# Patient Record
Sex: Female | Born: 1965 | Race: Black or African American | Hispanic: No | Marital: Single | Smoking: Current every day smoker
Health system: Southern US, Community
[De-identification: ages and names within clinical notes are randomized; demographics above are authoritative.]

---

## 2011-09-19 ENCOUNTER — Encounter (HOSPITAL_COMMUNITY): Payer: Self-pay | Admitting: Emergency Medicine

## 2011-09-19 ENCOUNTER — Emergency Department (HOSPITAL_COMMUNITY): Payer: Self-pay

## 2011-09-19 ENCOUNTER — Emergency Department (HOSPITAL_COMMUNITY)
Admission: EM | Admit: 2011-09-19 | Discharge: 2011-09-19 | Disposition: A | Discharge status: Home / Self Care | Payer: Self-pay | Attending: Emergency Medicine | Admitting: Emergency Medicine

## 2011-09-19 DIAGNOSIS — M25469 Effusion, unspecified knee: Secondary | ICD-10-CM | POA: Insufficient documentation

## 2011-09-19 DIAGNOSIS — S8992XA Unspecified injury of left lower leg, initial encounter: Secondary | ICD-10-CM

## 2011-09-19 NOTE — Discharge Instructions (Signed)

## 2011-09-19 NOTE — ED Notes (Signed)
Ortho tech paged to apply knee sleeve.

## 2011-09-19 NOTE — ED Provider Notes (Signed)
History     CSN: [id number]  Arrival date & time 09/19/11  1846   First MD Initiated Contact with Patient 09/19/11 1943      Chief Complaint  Patient presents with  . Leg Pain    (Consider location/radiation/quality/duration/timing/severity/associated sxs/prior treatment) HPI  46 year old female presents complaining of left knee injury. Patient states she was walking down the steps today while carrying her baby on the arm. She slipped on some wet steps and lost her balance. States she was trying to prevent falling on the baby, so instead she fell on her left knee and right shoulder. She denies hitting her head or LOC. She was able to ambulate afterward. she didn't notice any significant pain until later. Pain is primarily to the anterior aspect of her left knee, worsening with movement. Describe pain as gradual in onset, intermittent, and describe as sharp, throbbing sensation. Denies hip or ankle pain.  Denies any significant pain to R shoulder or arm.    History reviewed. No pertinent past medical history.  History reviewed. No pertinent past surgical history.  No family history on file.  History  Substance Use Topics  . Smoking status: Not on file  . Smokeless tobacco: Not on file  . Alcohol Use: Not on file    OB History    Grav Para Term Preterm Abortions TAB SAB Ect Mult Living                  Review of Systems  Constitutional: Negative for fatigue.  HENT: Negative for neck pain.   Musculoskeletal: Negative for back pain.  Neurological: Negative for headaches.  All other systems reviewed and are negative.    Allergies  Review of patient's allergies indicates no known allergies.  Home Medications   Current Outpatient Rx  Name Route Sig Dispense Refill  . IBUPROFEN 200 MG PO TABS Oral Take 400 mg by mouth every 8 (eight) hours as needed. For pain.      BP 126/83  Pulse 82  Temp(Src) 98.5 F (36.9 C) (Oral)  Resp 17  SpO2 100%  Physical Exam    Nursing note and vitals reviewed. Constitutional: She appears well-developed and well-nourished. No distress.  HENT:  Head: Normocephalic and atraumatic.  Eyes: Conjunctivae and EOM are normal. Pupils are equal, round, and reactive to light.  Neck: Normal range of motion. Neck supple.  Musculoskeletal:       Right shoulder: Normal.       Right hip: Normal.       Left hip: Normal.       Right knee: Normal.       Left knee: She exhibits swelling and ecchymosis. She exhibits normal range of motion, no effusion, no deformity, no laceration, no erythema, normal alignment and no LCL laxity. tenderness found. Medial joint line tenderness noted. No lateral joint line, no MCL, no LCL and no patellar tendon tenderness noted.    ED Course  Procedures (including critical care time)  Labs Reviewed - No data to display No results found.   No diagnosis found.  No results found for this or any previous visit. Dg Knee Complete 4 Views Left  09/19/2011  *RADIOLOGY REPORT*  Clinical Data: Leg pain.  History of trauma from a fall.  LEFT KNEE - COMPLETE 4+ VIEW  Comparison: No priors.  Findings: There is a suprapatellar effusion.  No acute displaced fracture, subluxation or dislocation is noted.  IMPRESSION: 1.  Negative for acute bony trauma. 2.  Small suprapatellar  effusion.  Original Report Authenticated By: Florencia Reasons, M.D.      MDM  L knee injury from fall.  No other significant injury.  Xray of L knee unremarkable for fx or dislocation.  Bruising noted to anterior aspect of knee.  Able to ambulate.  Will offer knee sleeve.  RICE discussed.  Pt voice understanding.          Fayrene Helper, PA-C 09/19/11 2041

## 2011-09-19 NOTE — ED Notes (Signed)
PA Tran at bedside. 

## 2011-09-19 NOTE — ED Notes (Signed)
Pt states she slipped down the stairs and injured L knee. Pt able to ambulate, but painful. Slight swelling and bruising to L knee.

## 2011-09-19 NOTE — ED Notes (Signed)
Patient returned from X-ray 

## 2011-09-20 NOTE — ED Provider Notes (Signed)
Medical screening examination/treatment/procedure(s) were performed by non-physician practitioner and as supervising physician I was immediately available for consultation/collaboration.  Donnetta Hutching, MD 09/20/11 [phone]

## 2013-12-24 ENCOUNTER — Emergency Department (HOSPITAL_COMMUNITY)
Admission: EM | Admit: 2013-12-24 | Discharge: 2013-12-24 | Disposition: A | Discharge status: Home / Self Care | Payer: Self-pay | Attending: Emergency Medicine | Admitting: Emergency Medicine

## 2013-12-24 ENCOUNTER — Encounter (HOSPITAL_COMMUNITY): Payer: Self-pay | Admitting: Emergency Medicine

## 2013-12-24 DIAGNOSIS — F17200 Nicotine dependence, unspecified, uncomplicated: Secondary | ICD-10-CM | POA: Insufficient documentation

## 2013-12-24 DIAGNOSIS — K089 Disorder of teeth and supporting structures, unspecified: Secondary | ICD-10-CM | POA: Insufficient documentation

## 2013-12-24 DIAGNOSIS — K0889 Other specified disorders of teeth and supporting structures: Secondary | ICD-10-CM

## 2013-12-24 MED ORDER — HYDROCODONE-ACETAMINOPHEN 5-325 MG PO TABS: 1.0000 | ORAL_TABLET | Freq: Four times a day (QID) | ORAL | Status: DC | PRN

## 2013-12-24 MED ORDER — PENICILLIN V POTASSIUM 500 MG PO TABS: 500.0000 mg | ORAL_TABLET | Freq: Four times a day (QID) | ORAL | Status: AC

## 2013-12-24 NOTE — ED Provider Notes (Signed)
CSN: [id number]     Arrival date & time 12/24/13  1025 History  This chart was scribed for non-physician practitioner, Jaynie Crumble, PA-C,working with Ward Givens, MD, by Karle Plumber, ED Scribe. This patient was seen in room TR04C/TR04C and the patient's care was started at 12:34 PM.  Chief Complaint  Patient presents with  . Dental Pain   Patient is a 48 y.o. female presenting with tooth pain. The history is provided by the patient. No language interpreter was used.  Dental Pain Associated symptoms: no facial swelling and no fever    HPI Comments:  Marie Galloway is a 48 y.o. female who presents to the Emergency Department complaining of severe right upper and lower dental pain that started 3 days ago. Pt states she has been using three different kinds of Oragel and NyQuil with minimal relief of her pain. She has not been able to eat due to the pain. She denies fever, chills, difficulty swallowing or facial swelling. She does not have a dentist.  History reviewed. No pertinent past medical history. History reviewed. No pertinent past surgical history. History reviewed. No pertinent family history. History  Substance Use Topics  . Smoking status: Current Every Day Smoker    Types: Cigarettes  . Smokeless tobacco: Not on file  . Alcohol Use: Yes   OB History   Grav Para Term Preterm Abortions TAB SAB Ect Mult Living                 Review of Systems  Constitutional: Negative for fever and chills.  HENT: Positive for dental problem. Negative for facial swelling and trouble swallowing.   Hematological: Does not bruise/bleed easily.    Allergies  Review of patient's allergies indicates no known allergies.  Home Medications   Prior to Admission medications   Not on File   Triage Vitals: BP 147/84  Pulse 70  Temp(Src) 98.5 F (36.9 C) (Oral)  Resp 17  Ht  (1.626 m)  Wt 115 lb (52.164 kg)  BMI 19.73 kg/m2  SpO2 97% Physical Exam  Nursing note and vitals  reviewed. Constitutional: She is oriented to person, place, and time. She appears well-developed and well-nourished.  HENT:  Head: Normocephalic and atraumatic.  Poor dentition, multiple dental caries. Tender to palpation over right lower first and second molars as well as right upper first and second molars. Mild surrounding, inflammation over lower gum. No drainage. No facial swelling. No trismus. No swelling under the tongue.  Eyes: EOM are normal.  Neck: Normal range of motion.  Cardiovascular: Normal rate.   Pulmonary/Chest: Effort normal.  Musculoskeletal: Normal range of motion.  Neurological: She is alert and oriented to person, place, and time.  Skin: Skin is warm and dry.  Psychiatric: She has a normal mood and affect. Her behavior is normal.    ED Course  Procedures (including critical care time) DIAGNOSTIC STUDIES: Oxygen Saturation is 97% on RA, normal by my interpretation.   COORDINATION OF CARE: 12:36 PM- Will prescribe antibiotic, pain medication and refer to dentist. Pt verbalizes understanding and agrees to plan.  Medications - No data to display  Labs Review Labs Reviewed - No data to display  Imaging Review No results found.   EKG Interpretation None      MDM   Final diagnoses:  Pain, dental    Patient with dental pain, will start on a biotics, pain medications. I would dentist. At this time no evidence of Ludwig's angina. Patient is afebrile, nontoxic  appearing.   Filed Vitals:   12/24/13 1047 12/24/13 1247  BP: 147/84 152/85  Pulse: 70 64  Temp: 98.5 F (36.9 C) 98.3 F (36.8 C)  TempSrc: Oral Oral  Resp: 17 16  Height:  (1.626 m)   Weight: 115 lb (52.164 kg)   SpO2: 97% 100%    personally performed the services described in this documentation, which was scribed in my presence. The recorded information has been reviewed and is accurate.    Lottie Mussel, PA-C 12/24/13 1647

## 2013-12-24 NOTE — ED Notes (Signed)
She had a toothache x 3 days

## 2013-12-24 NOTE — ED Notes (Signed)
C/o right upper and lower dental pain x 3 days.

## 2013-12-24 NOTE — Discharge Instructions (Signed)
Penicillin for pain until all gone. norco for severe pain. Follow up with a dentist. Return if worsening.    Dental Pain A tooth ache may be caused by cavities (tooth decay). Cavities expose the nerve of the tooth to air and hot or cold temperatures. It may come from an infection or abscess (also called a boil or furuncle) around your tooth. It is also often caused by dental caries (tooth decay). This causes the pain you are having. DIAGNOSIS  Your caregiver can diagnose this problem by exam. TREATMENT   If caused by an infection, it may be treated with medications which kill germs (antibiotics) and pain medications as prescribed by your caregiver. Take medications as directed.  Only take over-the-counter or prescription medicines for pain, discomfort, or fever as directed by your caregiver.  Whether the tooth ache today is caused by infection or dental disease, you should see your dentist as soon as possible for further care. SEEK MEDICAL CARE IF: The exam and treatment you received today has been provided on an emergency basis only. This is not a substitute for complete medical or dental care. If your problem worsens or new problems (symptoms) appear, and you are unable to meet with your dentist, call or return to this location. SEEK IMMEDIATE MEDICAL CARE IF:   You have a fever.  You develop redness and swelling of your face, jaw, or neck.  You are unable to open your mouth.  You have severe pain uncontrolled by pain medicine. MAKE SURE YOU:   Understand these instructions.  Will watch your condition.  Will get help right away if you are not doing well or get worse. Document Released: 04/01/2005 Document Revised: 06/24/2011 Document Reviewed: 11/18/2007 [facility] Patient Information 2015 [place], [place]. This information is not intended to replace advice given to you by your health care provider. Make sure you discuss any questions you have with your health care provider.

## 2014-01-04 NOTE — ED Provider Notes (Signed)
Medical screening examination/treatment/procedure(s) were performed by non-physician practitioner and as supervising physician I was immediately available for consultation/collaboration.   EKG Interpretation None      Devoria Albe, MD, Armando Gang   Ward Givens, MD 01/04/14 [phone]

## 2015-10-25 ENCOUNTER — Encounter (HOSPITAL_COMMUNITY): Payer: Self-pay | Admitting: *Deleted

## 2015-10-25 ENCOUNTER — Emergency Department (HOSPITAL_COMMUNITY)
Admission: EM | Admit: 2015-10-25 | Discharge: 2015-10-25 | Disposition: A | Discharge status: Home / Self Care | Payer: Self-pay | Attending: Emergency Medicine | Admitting: Emergency Medicine

## 2015-10-25 DIAGNOSIS — F17210 Nicotine dependence, cigarettes, uncomplicated: Secondary | ICD-10-CM | POA: Insufficient documentation

## 2015-10-25 DIAGNOSIS — K0889 Other specified disorders of teeth and supporting structures: Secondary | ICD-10-CM | POA: Insufficient documentation

## 2015-10-25 MED ORDER — PENICILLIN V POTASSIUM 500 MG PO TABS: 500.0000 mg | ORAL_TABLET | Freq: Four times a day (QID) | ORAL | Status: DC

## 2015-10-25 NOTE — ED Provider Notes (Signed)
CSN: [id number]     Arrival date & time 10/25/15  1204 History  By signing my name below, I, [facility], attest that this documentation has been prepared under the direction and in the presence of Roxy Horseman, PA-C. Electronically Signed: Randell Patient, ED Scribe. 10/25/2015. 12:32 PM.    Chief Complaint  Patient presents with  . Dental Pain    The history is provided by the patient. No language interpreter was used.   HPI Comments: Marie Galloway is a 50 y.o. female who presents to the Emergency Department complaining of constant, moderate, gradually worsening right dental pain to the bottom and top onset 2 weeks ago. NKDA. Denies having a dentist currently. Denies fever.  History reviewed. No pertinent past medical history. History reviewed. No pertinent past surgical history. History reviewed. No pertinent family history. Social History  Substance Use Topics  . Smoking status: Current Every Day Smoker    Types: Cigarettes  . Smokeless tobacco: None  . Alcohol Use: Yes   OB History    No data available     Review of Systems  Constitutional: Negative for fever.  HENT: Positive for dental problem.       Allergies  Review of patient's allergies indicates no known allergies.  Home Medications   Prior to Admission medications   Medication Sig Start Date End Date Taking? Authorizing Provider  HYDROcodone-acetaminophen (NORCO) 5-325 MG per tablet Take 1 tablet by mouth every 6 (six) hours as needed for moderate pain. 12/24/13   Tatyana Kirichenko, PA-C   BP 121/87 mmHg  Pulse 74  Temp(Src) 98.5 F (36.9 C) (Oral)  Resp 20  SpO2 98% Physical Exam Physical Exam  Constitutional: Pt appears well-developed and well-nourished.  HENT:  Head: Normocephalic.  Right Ear: Tympanic membrane, external ear and ear canal normal.  Left Ear: Tympanic membrane, external ear and ear canal normal.  Nose: Nose normal. Right sinus exhibits no maxillary sinus tenderness and  no frontal sinus tenderness. Left sinus exhibits no maxillary sinus tenderness and no frontal sinus tenderness.  Mouth/Throat: Uvula is midline, oropharynx is clear and moist and mucous membranes are normal. No oral lesions. No uvula swelling or lacerations. No oropharyngeal exudate, posterior oropharyngeal edema, posterior oropharyngeal erythema or tonsillar abscesses.  Poor dentition No gingival swelling, fluctuance or induration No gross abscess  No sublingual edema, tenderness to palpation, or sign of Ludwig's angina, or deep space infection Pain at right lower rear molars Eyes: Conjunctivae are normal. Pupils are equal, round, and reactive to light. Right eye exhibits no discharge. Left eye exhibits no discharge.  Neck: Normal range of motion. Neck supple.  No stridor Handling secretions without difficulty No nuchal rigidity No cervical lymphadenopathy Cardiovascular: Normal rate, regular rhythm and normal heart sounds.   Pulmonary/Chest: Effort normal. No respiratory distress.  Equal chest rise  Abdominal: Soft. Bowel sounds are normal. Pt exhibits no distension. There is no tenderness.  Lymphadenopathy: Pt has no cervical adenopathy.  Neurological: Pt is alert and oriented x 4  Skin: Skin is warm and dry.  Psychiatric: Pt has a normal mood and affect.  Nursing note and vitals reviewed.   ED Course  Procedures   DIAGNOSTIC STUDIES: Oxygen Saturation is 98% on RA, normal by my interpretation.    COORDINATION OF CARE: 12:31 PM Will prescribe antibiotics. Will provide pt with referral to a dentist. Advised pt to follow-up with a dentist. Discussed treatment plan with pt at bedside and pt agreed to plan.   MDM   Final  diagnoses:  Pain, dental    Patient with dentalgia.  No abscess requiring immediate incision and drainage.  Exam not concerning for Ludwig's angina or pharyngeal abscess.  Will treat with penicillin. Pt instructed to follow-up with dentist.  Discussed return  precautions. Pt safe for discharge.   I personally performed the services described in this documentation, which was scribed in my presence. The recorded information has been reviewed and is accurate.     Roxy HorsemanRobert Lestat Golob, PA-C 10/25/15 1235  Gerhard Munchobert Lockwood, MD 10/26/15 [phone]

## 2015-10-25 NOTE — ED Notes (Signed)
Declined W/C at D/C and was escorted to lobby by RN. 

## 2015-10-25 NOTE — ED Notes (Signed)
PT reports RT lower dental pain.

## 2015-10-25 NOTE — Discharge Instructions (Signed)

## 2019-07-04 ENCOUNTER — Encounter (HOSPITAL_COMMUNITY): Payer: Self-pay

## 2019-07-04 ENCOUNTER — Emergency Department (HOSPITAL_COMMUNITY): Payer: 59

## 2019-07-04 ENCOUNTER — Emergency Department (HOSPITAL_COMMUNITY)
Admission: EM | Admit: 2019-07-04 | Discharge: 2019-07-04 | Disposition: A | Discharge status: Home / Self Care | Payer: 59 | Attending: Emergency Medicine | Admitting: Emergency Medicine

## 2019-07-04 ENCOUNTER — Other Ambulatory Visit: Payer: Self-pay

## 2019-07-04 ENCOUNTER — Emergency Department (HOSPITAL_COMMUNITY): Admission: EM | Admit: 2019-07-04 | Discharge: 2019-07-04 | Discharge status: Absent without leave | Payer: 59

## 2019-07-04 DIAGNOSIS — Z79899 Other long term (current) drug therapy: Secondary | ICD-10-CM | POA: Insufficient documentation

## 2019-07-04 DIAGNOSIS — R109 Unspecified abdominal pain: Secondary | ICD-10-CM | POA: Insufficient documentation

## 2019-07-04 LAB — CBC
HCT: 39.7 % (ref 36.0–46.0)
Hemoglobin: 13 g/dL (ref 12.0–15.0)
MCH: 31.3 pg (ref 26.0–34.0)
MCHC: 32.7 g/dL (ref 30.0–36.0)
MCV: 95.7 fL (ref 80.0–100.0)
Platelets: 318 10*3/uL (ref 150–400)
RBC: 4.15 MIL/uL (ref 3.87–5.11)
RDW: 13.2 % (ref 11.5–15.5)
WBC: 10.8 10*3/uL — ABNORMAL HIGH (ref 4.0–10.5)
nRBC: 0 % (ref 0.0–0.2)

## 2019-07-04 LAB — URINALYSIS, ROUTINE W REFLEX MICROSCOPIC
Bilirubin Urine: NEGATIVE
Glucose, UA: NEGATIVE mg/dL
Hgb urine dipstick: NEGATIVE
Ketones, ur: NEGATIVE mg/dL
Leukocytes,Ua: NEGATIVE
Nitrite: NEGATIVE
Protein, ur: NEGATIVE mg/dL
Specific Gravity, Urine: 1.01 (ref 1.005–1.030)
pH: 6 (ref 5.0–8.0)

## 2019-07-04 LAB — COMPREHENSIVE METABOLIC PANEL
ALT: 13 U/L (ref 0–44)
AST: 15 U/L (ref 15–41)
Albumin: 4 g/dL (ref 3.5–5.0)
Alkaline Phosphatase: 57 U/L (ref 38–126)
Anion gap: 10 (ref 5–15)
BUN: 11 mg/dL (ref 6–20)
CO2: 26 mmol/L (ref 22–32)
Calcium: 9.1 mg/dL (ref 8.9–10.3)
Chloride: 107 mmol/L (ref 98–111)
Creatinine, Ser: 0.62 mg/dL (ref 0.44–1.00)
GFR calc Af Amer: >60 mL/min (ref >60)
GFR calc non Af Amer: >60 mL/min (ref >60)
Glucose, Bld: 97 mg/dL (ref 70–99)
Potassium: 3.6 mmol/L (ref 3.5–5.1)
Sodium: 143 mmol/L (ref 135–145)
Total Bilirubin: 0.5 mg/dL (ref 0.3–1.2)
Total Protein: 7.2 g/dL (ref 6.5–8.1)

## 2019-07-04 LAB — LIPASE, BLOOD: Lipase: 21 U/L (ref 11–51)

## 2019-07-04 MED ORDER — SODIUM CHLORIDE (PF) 0.9 % IJ SOLN
INTRAMUSCULAR | Status: AC
  Filled 2019-07-04: qty 50

## 2019-07-04 MED ORDER — LIDOCAINE 5 % EX PTCH: 1.0000 | MEDICATED_PATCH | CUTANEOUS | 0 refills | Status: AC

## 2019-07-04 MED ORDER — MORPHINE SULFATE (PF) 4 MG/ML IV SOLN
4.0000 mg | Freq: Once | INTRAVENOUS | Status: AC
  Administered 2019-07-04: 4 mg via INTRAVENOUS
  Filled 2019-07-04: qty 1

## 2019-07-04 MED ORDER — IOHEXOL 300 MG/ML  SOLN
100.0000 mL | Freq: Once | INTRAMUSCULAR | Status: AC | PRN
  Administered 2019-07-04: 18:00:00 100 mL via INTRAVENOUS

## 2019-07-04 MED ORDER — SODIUM CHLORIDE 0.9% FLUSH: 3.0000 mL | Freq: Once | INTRAVENOUS | Status: DC

## 2019-07-04 MED ORDER — ONDANSETRON HCL 4 MG/2ML IJ SOLN
4.0000 mg | Freq: Once | INTRAMUSCULAR | Status: AC
  Administered 2019-07-04: 19:00:00 4 mg via INTRAVENOUS
  Filled 2019-07-04: qty 2

## 2019-07-04 MED ORDER — NAPROXEN 500 MG PO TABS: 500.0000 mg | ORAL_TABLET | Freq: Two times a day (BID) | ORAL | 0 refills | Status: DC

## 2019-07-04 NOTE — ED Provider Notes (Addendum)
[facility]-EMERGENCY DEPT Provider Note   CSN: [id number] Arrival date & time: 07/04/19  1607    History Chief Complaint  Patient presents with  . Abdominal Pain    Marie Galloway is a 54 y.o. female with no significant past medical history who presents for evaluation of abdominal pain and flank pain.  Patient with intermittent right lower quadrant abdominal pain and right flank pain x2 weeks.  Pain rated a 3/10.  Denies fever, chills, nausea, vomiting, chest pain, shortness of breath, diarrhea or constipation.  States she has had some mild dysuria. Worse with movement and laying on her right side. Denies any pelvic pain, vaginal discharge.  She denies any concerns for any STDs.  Does not want anything for pain at this time.  Denies additional aggravating or alleviating factors.     History obtained from patient and past medical records. No interpretor was used.  HPI     History reviewed. No pertinent past medical history.  There are no problems to display for this patient.   History reviewed. No pertinent surgical history.   OB History   No obstetric history on file.     History reviewed. No pertinent family history.  Social History   Tobacco Use  . Smoking status: Not on file  Substance Use Topics  . Alcohol use: Not on file  . Drug use: Not on file    Home Medications Prior to Admission medications   Medication Sig Start Date End Date Taking? Authorizing Provider  ibuprofen (ADVIL,MOTRIN) 200 MG tablet Take 400 mg by mouth every 8 (eight) hours as needed for fever, mild pain or moderate pain. For pain.    Yes [provider]  lidocaine (LIDODERM) 5 % Place 1 patch onto the skin daily. Remove & Discard patch within 12 hours or as directed by MD 07/04/19   Llewyn Heap A, PA-C  naproxen (NAPROSYN) 500 MG tablet Take 1 tablet (500 mg total) by mouth 2 (two) times daily. 07/04/19   Heily Carlucci A, PA-C    Allergies    Patient has  no known allergies.  Review of Systems   Review of Systems  Constitutional: Negative.   HENT: Negative.   Respiratory: Negative.   Cardiovascular: Negative.   Gastrointestinal: Positive for abdominal pain. Negative for abdominal distention, anal bleeding, blood in stool, constipation, diarrhea, nausea, rectal pain and vomiting.  Genitourinary: Positive for dysuria and flank pain. Negative for decreased urine volume, difficulty urinating, dyspareunia, enuresis, frequency, genital sores, hematuria, menstrual problem, pelvic pain, urgency, vaginal bleeding, vaginal discharge and vaginal pain.  Skin: Negative.   Neurological: Negative.   All other systems reviewed and are negative.   Physical Exam Updated Vital Signs BP (!) 162/76   Pulse 70   Temp 98.4 F (36.9 C) (Oral)   Resp 18   SpO2 99%   Physical Exam Vitals and nursing note reviewed.  Constitutional:      General: She is not in acute distress.    Appearance: She is well-developed. She is not ill-appearing, toxic-appearing or diaphoretic.  HENT:     Head: Normocephalic and atraumatic.     Mouth/Throat:     Mouth: Mucous membranes are moist.  Eyes:     Pupils: Pupils are equal, round, and reactive to light.  Cardiovascular:     Rate and Rhythm: Normal rate.     Pulses: Normal pulses.          Dorsalis pedis pulses are 2+ on the right side  and 2+ on the left side.       Posterior tibial pulses are 2+ on the right side and 2+ on the left side.     Heart sounds: Normal heart sounds.  Pulmonary:     Effort: Pulmonary effort is normal. No respiratory distress.     Breath sounds: Normal breath sounds and air entry.  Abdominal:     General: Bowel sounds are normal. There is no distension.     Palpations: Abdomen is soft.     Tenderness: There is abdominal tenderness in the right lower quadrant and suprapubic area. There is right CVA tenderness. There is no guarding or rebound. Negative signs include Murphy's sign and  McBurney's sign.     Hernia: No hernia is present.  Musculoskeletal:        General: Normal range of motion.     Cervical back: Normal range of motion.     Thoracic back: Normal.     Lumbar back: Normal.     Right hip: Normal.     Left hip: Normal.     Right upper leg: Normal. No swelling.     Left upper leg: Normal.     Comments: No midline tenderness to thoracic or lumbar spine.  No paraspinal tenderness, step-offs.  No tenderness over piriformis.  Negative straight leg raise. Pelvis stable, non tender to palpation.  Compartments soft.  Skin:    General: Skin is warm and dry.     Capillary Refill: Capillary refill takes less than 2 seconds.     Comments: Brisk cap refill.  No edema, erythema or warmth.  No abnormal pallor.  Neurological:     General: No focal deficit present.     Mental Status: She is alert and oriented to person, place, and time.     ED Results / Procedures / Treatments   Labs (all labs ordered are listed, but only abnormal results are displayed) Labs Reviewed  CBC - Abnormal; Notable for the following components:      Result Value   WBC 10.8 (*)    All other components within normal limits  LIPASE, BLOOD  COMPREHENSIVE METABOLIC PANEL  URINALYSIS, ROUTINE W REFLEX MICROSCOPIC    EKG None  Radiology CT ABDOMEN PELVIS W CONTRAST  Result Date: 07/04/2019 CLINICAL DATA:  Right quadrant abdominal pain EXAM: CT ABDOMEN AND PELVIS WITH CONTRAST TECHNIQUE: Multidetector CT imaging of the abdomen and pelvis was performed using the standard protocol following bolus administration of intravenous contrast. CONTRAST:  156mL OMNIPAQUE IOHEXOL 300 MG/ML  SOLN COMPARISON:  None. FINDINGS: Lower chest: No acute abnormality. Hepatobiliary: No focal liver. Gallbladder is contracted. No biliary dilatation. Pancreas: Unremarkable. Spleen: Unremarkable. Adrenals/Urinary Tract: Unremarkable. Stomach/Bowel: Stomach is within normal limits. Bowel is normal in caliber. Areas of  apparent colonic wall thickening likely secondary to underdistention. Normal appendix. Vascular/Lymphatic: Aortic atherosclerosis. No enlarged abdominal or pelvic lymph nodes. Reproductive: Uterus and bilateral adnexa are unremarkable. Other: No ascites. Musculoskeletal: No acute osseous abnormality. IMPRESSION: No findings to account for reported symptoms. Electronically Signed   By: Macy Mis M.D.   On: 07/04/2019 19:01    Procedures Procedures (including critical care time)  Medications Ordered in ED Medications  sodium chloride flush (NS) 0.9 % injection 3 mL (0 mLs Intravenous Hold 07/04/19 1637)  sodium chloride (PF) 0.9 % injection (has no administration in time range)  morphine 4 MG/ML injection 4 mg (4 mg Intravenous Given 07/04/19 1830)  ondansetron (ZOFRAN) injection 4 mg (4 mg Intravenous Given  07/04/19 1830)  iohexol (OMNIPAQUE) 300 MG/ML solution 100 mL (100 mLs Intravenous Contrast Given 07/04/19 1816)   ED Course  I have reviewed the triage vital signs and the nursing notes.  Pertinent labs & imaging results that were available during my care of the patient were reviewed by me and considered in my medical decision making (see chart for details).  54 year old female presents for RLQ abd right flank pain intermittent over 2 weeks. Afebrile, non septic, non ill appearing. Negative psoas, obturator sign. tenderness CVA however negative tap. No red flags for back pain. No recent falls injury. Low suspicion for acute neurosurgical emergency. No pelvic pain, vag dc concern for STD. Plan on labs, imaging reassess. Does not want anything for pain at this time.  Labs and imaging personally reviewed and interpreted: CBC mild leukocytosis at 10.8 Metabolic panel without electrolyte, renal or liver normality Urinalysis negative for infection Patient is postmenopausal x4 years, have low suspicion for pregnancy CT abdomen pelvis without acute abnormality.  Patient reassessed.  I am able  to reproduce her flank pain on exam.  I am highly suspicious for musculoskeletal pain at this time.  Low suspicion for kidney stone, no hematuria urine.  Shared decision making for pelvic exam.  Patient has no concerns for STDs, vaginal discharge, pelvic pain does not want pelvic exam at this time.  I feels is reasonable.  I have low suspicion for torsion, PID, TOA.  Patient to be discharged home with symptomatic management.  Patient is nontoxic, nonseptic appearing, in no apparent distress.  Patient's pain and other symptoms adequately managed in emergency department.  Fluid bolus given.  Labs, imaging and vitals reviewed.  Patient does not meet the SIRS or Sepsis criteria.  On repeat exam patient does not have a surgical abdomin and there are no peritoneal signs.  No indication of appendicitis, bowel obstruction, bowel perforation, cholecystitis, diverticulitis.   The patient has been appropriately medically screened and/or stabilized in the ED. I have low suspicion for any other emergent medical condition which would require further screening, evaluation or treatment in the ED or require inpatient management.  Patient is hemodynamically stable and in no acute distress.  Patient able to ambulate in department prior to ED.  Evaluation does not show acute pathology that would require ongoing or additional emergent interventions while in the emergency department or further inpatient treatment.  I have discussed the diagnosis with the patient and answered all questions.  Pain is been managed while in the emergency department and patient has no further complaints prior to discharge.  Patient is comfortable with plan discussed in room and is stable for discharge at this time.  I have discussed strict return precautions for returning to the emergency department.  Patient was encouraged to follow-up with PCP/specialist refer to at discharge.    MDM Rules/Calculators/A&P                       Final Clinical  Impression(s) / ED Diagnoses Final diagnoses:  Flank pain    Rx / DC Orders ED Discharge Orders         Ordered    lidocaine (LIDODERM) 5 %  Every 24 hours     07/04/19 2022    naproxen (NAPROSYN) 500 MG tablet  2 times daily     07/04/19 2022           Jadesola Poynter A, PA-C 07/04/19 2053    Aris Even A, PA-C 07/04/19  8502    Mancel Bale, MD 07/05/19 2324

## 2019-07-04 NOTE — Discharge Instructions (Signed)
Return for new or worsening symptoms

## 2019-07-04 NOTE — ED Triage Notes (Signed)
Pt presents with c/o RLQ abdominal pain for 2 weeks. Pt reports the pain is also present in her back and radiates down her right leg.

## 2019-07-10 ENCOUNTER — Emergency Department (HOSPITAL_COMMUNITY): Payer: 59

## 2019-07-10 ENCOUNTER — Emergency Department (HOSPITAL_COMMUNITY)
Admission: EM | Admit: 2019-07-10 | Discharge: 2019-07-10 | Disposition: A | Discharge status: Home / Self Care | Payer: 59 | Attending: Emergency Medicine | Admitting: Emergency Medicine

## 2019-07-10 ENCOUNTER — Other Ambulatory Visit: Payer: Self-pay

## 2019-07-10 ENCOUNTER — Encounter (HOSPITAL_COMMUNITY): Payer: Self-pay | Admitting: *Deleted

## 2019-07-10 DIAGNOSIS — F17210 Nicotine dependence, cigarettes, uncomplicated: Secondary | ICD-10-CM | POA: Insufficient documentation

## 2019-07-10 DIAGNOSIS — R110 Nausea: Secondary | ICD-10-CM | POA: Insufficient documentation

## 2019-07-10 DIAGNOSIS — R109 Unspecified abdominal pain: Secondary | ICD-10-CM

## 2019-07-10 DIAGNOSIS — R102 Pelvic and perineal pain: Secondary | ICD-10-CM

## 2019-07-10 DIAGNOSIS — R1031 Right lower quadrant pain: Secondary | ICD-10-CM | POA: Insufficient documentation

## 2019-07-10 DIAGNOSIS — M545 Low back pain: Secondary | ICD-10-CM | POA: Diagnosis not present

## 2019-07-10 LAB — COMPREHENSIVE METABOLIC PANEL
ALT: 10 U/L (ref 0–44)
AST: 13 U/L — ABNORMAL LOW (ref 15–41)
Albumin: 3.9 g/dL (ref 3.5–5.0)
Alkaline Phosphatase: 51 U/L (ref 38–126)
Anion gap: 8 (ref 5–15)
BUN: 14 mg/dL (ref 6–20)
CO2: 25 mmol/L (ref 22–32)
Calcium: 8.8 mg/dL — ABNORMAL LOW (ref 8.9–10.3)
Chloride: 107 mmol/L (ref 98–111)
Creatinine, Ser: 0.5 mg/dL (ref 0.44–1.00)
GFR calc Af Amer: >60 mL/min (ref >60)
GFR calc non Af Amer: >60 mL/min (ref >60)
Glucose, Bld: 100 mg/dL — ABNORMAL HIGH (ref 70–99)
Potassium: 3.8 mmol/L (ref 3.5–5.1)
Sodium: 140 mmol/L (ref 135–145)
Total Bilirubin: 0.5 mg/dL (ref 0.3–1.2)
Total Protein: 6.8 g/dL (ref 6.5–8.1)

## 2019-07-10 LAB — CBC WITH DIFFERENTIAL/PLATELET
Abs Immature Granulocytes: 0.02 10*3/uL (ref 0.00–0.07)
Basophils Absolute: 0.1 10*3/uL (ref 0.0–0.1)
Basophils Relative: 1 %
Eosinophils Absolute: 0.1 10*3/uL (ref 0.0–0.5)
Eosinophils Relative: 2 %
HCT: 39.2 % (ref 36.0–46.0)
Hemoglobin: 12.4 g/dL (ref 12.0–15.0)
Immature Granulocytes: 0 %
Lymphocytes Relative: 37 %
Lymphs Abs: 3.2 10*3/uL (ref 0.7–4.0)
MCH: 30.2 pg (ref 26.0–34.0)
MCHC: 31.6 g/dL (ref 30.0–36.0)
MCV: 95.6 fL (ref 80.0–100.0)
Monocytes Absolute: 1 10*3/uL (ref 0.1–1.0)
Monocytes Relative: 11 %
Neutro Abs: 4.4 10*3/uL (ref 1.7–7.7)
Neutrophils Relative %: 49 %
Platelets: 294 10*3/uL (ref 150–400)
RBC: 4.1 MIL/uL (ref 3.87–5.11)
RDW: 13.1 % (ref 11.5–15.5)
WBC: 8.8 10*3/uL (ref 4.0–10.5)
nRBC: 0 % (ref 0.0–0.2)

## 2019-07-10 LAB — WET PREP, GENITAL
Sperm: NONE SEEN
Trich, Wet Prep: NONE SEEN
Yeast Wet Prep HPF POC: NONE SEEN

## 2019-07-10 LAB — URINALYSIS, ROUTINE W REFLEX MICROSCOPIC
Bilirubin Urine: NEGATIVE
Glucose, UA: NEGATIVE mg/dL
Hgb urine dipstick: NEGATIVE
Ketones, ur: NEGATIVE mg/dL
Nitrite: POSITIVE — AB
Protein, ur: 30 mg/dL — AB
Specific Gravity, Urine: 1.026 (ref 1.005–1.030)
pH: 5 (ref 5.0–8.0)

## 2019-07-10 LAB — LIPASE, BLOOD: Lipase: 22 U/L (ref 11–51)

## 2019-07-10 LAB — I-STAT BETA HCG BLOOD, ED (MC, WL, AP ONLY): I-stat hCG, quantitative: <5 m[IU]/mL (ref <5)

## 2019-07-10 MED ORDER — ONDANSETRON 4 MG PO TBDP: 4.0000 mg | ORAL_TABLET | Freq: Three times a day (TID) | ORAL | 0 refills | Status: AC | PRN

## 2019-07-10 MED ORDER — DOXYCYCLINE HYCLATE 100 MG PO CAPS
100.0000 mg | ORAL_CAPSULE | Freq: Two times a day (BID) | ORAL | 0 refills | Status: DC
Start: 2019-07-10 — End: 2019-07-21

## 2019-07-10 MED ORDER — ONDANSETRON HCL 4 MG/2ML IJ SOLN
4.0000 mg | Freq: Once | INTRAMUSCULAR | Status: AC
  Administered 2019-07-10: 10:00:00 4 mg via INTRAVENOUS
  Filled 2019-07-10: qty 2

## 2019-07-10 MED ORDER — DIAZEPAM 5 MG PO TABS: 5.0000 mg | ORAL_TABLET | Freq: Once | ORAL | Status: DC

## 2019-07-10 MED ORDER — MELOXICAM 15 MG PO TABS: 15.0000 mg | ORAL_TABLET | Freq: Every day | ORAL | 0 refills | Status: DC

## 2019-07-10 MED ORDER — MORPHINE SULFATE (PF) 4 MG/ML IV SOLN
4.0000 mg | Freq: Once | INTRAVENOUS | Status: AC
  Administered 2019-07-10: 10:00:00 4 mg via INTRAVENOUS
  Filled 2019-07-10: qty 1

## 2019-07-10 MED ORDER — LIDOCAINE HCL (PF) 1 % IJ SOLN
INTRAMUSCULAR | Status: AC
  Administered 2019-07-10: 30 mL
  Filled 2019-07-10: qty 30

## 2019-07-10 MED ORDER — KETOROLAC TROMETHAMINE 15 MG/ML IJ SOLN
15.0000 mg | Freq: Once | INTRAMUSCULAR | Status: AC
  Administered 2019-07-10: 15 mg via INTRAVENOUS
  Filled 2019-07-10: qty 1

## 2019-07-10 MED ORDER — METHOCARBAMOL 500 MG PO TABS: 500.0000 mg | ORAL_TABLET | Freq: Three times a day (TID) | ORAL | 0 refills | Status: AC | PRN

## 2019-07-10 MED ORDER — CEFTRIAXONE SODIUM 1 G IJ SOLR
500.0000 mg | Freq: Once | INTRAMUSCULAR | Status: AC
  Administered 2019-07-10: 14:00:00 500 mg via INTRAMUSCULAR
  Filled 2019-07-10: qty 10

## 2019-07-10 MED ORDER — SODIUM CHLORIDE 0.9 % IV BOLUS
1000.0000 mL | Freq: Once | INTRAVENOUS | Status: DC
  Administered 2019-07-10: 1000 mL via INTRAVENOUS

## 2019-07-10 MED ORDER — METRONIDAZOLE 500 MG PO TABS: 500.0000 mg | ORAL_TABLET | Freq: Two times a day (BID) | ORAL | 0 refills | Status: AC

## 2019-07-10 NOTE — ED Notes (Signed)
Patient still need a UA this Clinical research associate clicked off the order by mistake

## 2019-07-10 NOTE — Discharge Instructions (Addendum)
You were seen in the emergency department today for right-sided pain.  Your work-up was overall reassuring.  Your labs show that your calcium was bit low, you can see attached diet guidelines.  Your ultrasound did not show any cyst or problems with your ovaries.  We suspect the majority of your pain is related to muscle pain.  There may also be a pelvic infection present in his pelvic inflammatory disease given your discomfort with the pelvic exam.  We are sending you home with the following medicines to treat each of these problems: - Meloxicam is a nonsteroidal anti-inflammatory medication that will help with pain and swelling. Be sure to take this medication as prescribed with food, 1 pill every 24 hours,  It should be taken with food, as it can cause stomach upset, and more seriously, stomach bleeding. Do not take other nonsteroidal anti-inflammatory medications with this such as Advil, Motrin, Aleve, Mobic, naproxen Goodie Powder, or Motrin.  STOP THE NAPROXEN FROM PRIOR VISIT  - Robaxin is the muscle relaxer I have prescribed, this is meant to help with muscle tightness. Be aware that this medication may make you drowsy therefore the first time you take this it should be at a time you are in an environment where you can rest. Do not drive or operate heavy machinery when taking this medication. Do not drink alcohol or take other sedating medications with this medicine such as narcotics or benzodiazepines.   -Zofran: Take every 8 hours as needed for nausea and vomiting. We have prescribed you new medication(s) today. Discuss the medications prescribed today with your pharmacist as they can have adverse effects and interactions with your other medicines including over the counter and prescribed medications. Seek medical evaluation if you start to experience new or abnormal symptoms after taking one of these medicines, seek care immediately if you start to experience difficulty breathing, feeling of your  throat closing, facial swelling, or rash as these could be indications of a more serious allergic reaction

## 2019-07-10 NOTE — ED Provider Notes (Signed)
[facility]-EMERGENCY DEPT Provider Note   CSN: [id number] Arrival date & time: 07/10/19  5449     History Chief Complaint  Patient presents with  . rt sided pain    Marie Galloway is a 54 y.o. female with a history of tobacco abuse who presents to the ED with complaints of R side pain x 3 weeks. Patient states pain is intermittent, located to the R lower back, R abdomen, & into the R medial thigh, worse with movement/ambulation, no alleviating factors. Seen in the ED for same 03/21- states she had a negative CT scan and was discharged home with naproxen & lidoderm prescriptions which she has present with her in the ED- taking without much relief. She does a lot of heavy lifting at work, but does not recall a specific traumatic injury. Has some nausea when pain gets really bad. Denies fever, chills, emesis, dysuria, hematuria, vaginal bleeding, vaginal discharge, diarrhea, melena, numbness, tingling, weakness, saddle anesthesia, incontinence to bowel/bladder,  IV drug use,  or hx of cancer. Sexually active with 1 partner without concern for STD.    HPI     History reviewed. No pertinent past medical history.  There are no problems to display for this patient.   History reviewed. No pertinent surgical history.   OB History   No obstetric history on file.     No family history on file.  Social History   Tobacco Use  . Smoking status: Current Every Day Smoker    Types: Cigarettes  . Smokeless tobacco: Never Used  Substance Use Topics  . Alcohol use: Yes  . Drug use: No    Home Medications Prior to Admission medications   Medication Sig Start Date End Date Taking? Authorizing Provider  HYDROcodone-acetaminophen (NORCO) 5-325 MG per tablet Take 1 tablet by mouth every 6 (six) hours as needed for moderate pain. 12/24/13   Kirichenko, Lemont Fillers, PA-C  penicillin v potassium (VEETID) 500 MG tablet Take 1 tablet (500 mg total) by mouth 4 (four) times daily.  10/25/15   Roxy Horseman, PA-C    Allergies    Patient has no known allergies.  Review of Systems   Review of Systems  Constitutional: Negative for chills and fever.  Respiratory: Negative for shortness of breath.   Cardiovascular: Negative for chest pain and leg swelling.  Gastrointestinal: Positive for abdominal pain and nausea. Negative for blood in stool, constipation, diarrhea and vomiting.  Genitourinary: Negative for dysuria, hematuria, vaginal bleeding and vaginal discharge.  Musculoskeletal: Positive for back pain and myalgias.  Neurological: Negative for weakness and numbness.       Negative for incontinence or saddle anesthesia.  All other systems reviewed and are negative.   Physical Exam Updated Vital Signs BP 127/82 (BP Location: Left Arm)   Pulse 72   Temp 98.1 F (36.7 C) (Oral)   Resp 17   Ht 5\' 4"  (1.626 m)   Wt 49.9 kg   SpO2 100%   BMI 18.88 kg/m   Physical Exam Vitals and nursing note reviewed. Exam conducted with a chaperone present.  Constitutional:      Appearance: She is well-developed. She is not toxic-appearing.     Comments: Appears somewhat uncomfortable  HENT:     Head: Normocephalic and atraumatic.  Eyes:     General:        Right eye: No discharge.        Left eye: No discharge.     Conjunctiva/sclera: Conjunctivae normal.  Cardiovascular:  Rate and Rhythm: Normal rate and regular rhythm.     Comments: 2+ symmetric DP pulses bilaterally.  Pulmonary:     Effort: Pulmonary effort is normal. No respiratory distress.     Breath sounds: Normal breath sounds. No wheezing, rhonchi or rales.  Abdominal:     General: There is no distension.     Palpations: Abdomen is soft.     Tenderness: There is abdominal tenderness (RLQ). There is right CVA tenderness. There is no guarding or rebound.  Genitourinary:    Labia:        Right: No lesion.        Left: No lesion.      Cervix: No cervical motion tenderness, friability or erythema.      Adnexa:        Right: Tenderness present. No mass or fullness.         Left: No mass, tenderness or fullness.       Comments: Mild white discharge present.  Musculoskeletal:     Cervical back: Normal range of motion and neck supple. No spinous process tenderness or muscular tenderness.     Comments: No obvious deformity, appreciable swelling, erythema, ecchymosis, significant open wounds, or increased warmth.  Back: No point/focal vertebral tenderness, no palpable step off or crepitus. R lumbar paraspinal muscle tenderness to palpation.  Lower extremities: Intact AROM. Tender to the medial right proximal thigh. No point/focal bony tenderness.   Skin:    General: Skin is warm and dry.     Findings: No rash.  Neurological:     Mental Status: She is alert.     Deep Tendon Reflexes:     Reflex Scores:      Patellar reflexes are 2+ on the right side and 2+ on the left side.    Comments: Sensation grossly intact to bilateral lower extremities. 5/5 symmetric strength with plantar/dorsiflexion bilaterally. Gait is intact without obvious foot drop.   Psychiatric:        Behavior: Behavior normal.    ED Results / Procedures / Treatments   Labs (all labs ordered are listed, but only abnormal results are displayed) Labs Reviewed  WET PREP, GENITAL - Abnormal; Notable for the following components:      Result Value   Clue Cells Wet Prep HPF POC PRESENT (*)    WBC, Wet Prep HPF POC MANY (*)    All other components within normal limits  COMPREHENSIVE METABOLIC PANEL - Abnormal; Notable for the following components:   Glucose, Bld 100 (*)    Calcium 8.8 (*)    AST 13 (*)    All other components within normal limits  CBC WITH DIFFERENTIAL/PLATELET  LIPASE, BLOOD  URINALYSIS, ROUTINE W REFLEX MICROSCOPIC  I-STAT BETA HCG BLOOD, ED ([facility], [facility], [facility] ONLY)  GC/CHLAMYDIA PROBE AMP ([place]) NOT AT [facility]    EKG None  Radiology US PELVIC COMPLETE W TRANSVAGINAL AND TORSION R/O  Result Date:  07/10/2019 CLINICAL DATA:  54 year old postmenopausal female presenting with right lower quadrant and right lower extremity pain. EXAM: TRANSABDOMINAL AND TRANSVAGINAL ULTRASOUND OF PELVIS DOPPLER ULTRASOUND OF OVARIES TECHNIQUE: Both transabdominal and transvaginal ultrasound examinations of the pelvis were performed. Transabdominal technique was performed for global imaging of the pelvis including uterus, ovaries, adnexal regions, and pelvic cul-de-sac. It was necessary to proceed with endovaginal exam following the transabdominal exam to visualize the endometrium and adnexa. Color and duplex Doppler ultrasound was utilized to evaluate blood flow to the ovaries. COMPARISON:  None. FINDINGS: Uterus  Measurements: 5.3 x 2.1 x 3.5 cm = volume: 21 mL. Small anteverted uterus is normal in configuration, with no uterine fibroids or other myometrial abnormalities. Endometrium Thickness: 1 mm. No endometrial cavity fluid or focal endometrial mass. Right ovary Measurements: 1.4 x 1.4 x 1.2 cm = volume: 1.3 mL. Normal appearance/no adnexal mass. Left ovary Measurements: 1.5 x 1.5 x 1.7 cm = volume: 2.0 mL. Normal appearance/no adnexal mass. Pulsed Doppler evaluation of both ovaries demonstrates normal low-resistance arterial and venous waveforms. Other findings No abnormal free fluid. IMPRESSION: Normal postmenopausal pelvic ultrasound. Small ovaries with no evidence of adnexal torsion. Electronically Signed   By: Delbert Phenix M.D.   On: 07/10/2019 13:30    Procedures Procedures (including critical care time)  Medications Ordered in ED Medications - No data to display  ED Course  I have reviewed the triage vital signs and the nursing notes.  Pertinent labs & imaging results that were available during my care of the patient were reviewed by me and considered in my medical decision making (see chart for details).  Patient states she was seen in the ED 07/04/19- had CT imaging, discharged home with lidoderm  patches & naproxen which she is utilizing without relief. She states they mixed her chart up with someone with the same name that day- confirms her DOB is Oct 09, 2065- states they put her information in another person's chart with a birthday on the [birth year]th-- correlated this with chart review. CT imaging that day of the abdomen/pelvis reviewed, no acute process.    MDM Rules/Calculators/A&P                      Patient presents to the ED with R sided pain (back, abdomen, pelvis, inner thigh).  She intermittently appears uncomfortable but is nontoxic. Vitals without significant abnormality.  Tender to the R CVA, lumbar paraspinal muscles, RLQ, and R medial thigh. NO peritoneal signs. NO neuro deficits. Recent CT for same sxs was negative for acute process.   CBC: No leukocytosis or anemia.  CMP: Mild hypocalcemia, no significant electrolyte derangement.  Lipase: WNL Preg test: Negative.  Wet prep: BV GC/chlaymdia: Pending. [place]:  Normal postmenopausal pelvic ultrasound. Small ovaries with no evidence of adnexal torsion.   On re-assessment patient feeling much better, she is ambulatory, she remains without peritoneal signs- recent CT w/ similar presentation without acute process- do not feel this needs repeat today. UA Pending, however covering w/ abx and culture sent- no urinary sxs currently. Suspect this is primarily MSK, but with her adnexal tenderness and being sexually active will cover for PID as well. Muscle relaxant to be prescribed, stop naproxen, start meloxicam to see if this works better for the patient. Doxy/flagyl- discussed no EtOH with this medicine. I discussed results, treatment plan, need for follow-up, and return precautions with the patient. Provided opportunity for questions, patient confirmed understanding and is in agreement with plan.   Findings and plan of care discussed with supervising physician Dr. Lynelle Doctor who is in agreement.    Final Clinical Impression(s) / ED  Diagnoses Final diagnoses:  Side pain    Rx / DC Orders ED Discharge Orders         Ordered    meloxicam (MOBIC) 15 MG tablet  Daily     07/10/19 1524    methocarbamol (ROBAXIN) 500 MG tablet  Every 8 hours PRN     07/10/19 1524    doxycycline (VIBRAMYCIN) 100 MG capsule  2 times daily  07/10/19 1524    metroNIDAZOLE (FLAGYL) 500 MG tablet  2 times daily     07/10/19 1524    ondansetron (ZOFRAN ODT) 4 MG disintegrating tablet  Every 8 hours PRN     07/10/19 1524           Edd Reppert, Glynda Jaeger, PA-C 07/10/19 1525    Dorie Rank, MD 07/11/19 [phone]

## 2019-07-10 NOTE — ED Triage Notes (Signed)
About a week of rt side and leg pain, seen here a couple of days ago for same

## 2019-07-10 NOTE — ED Notes (Signed)
Pt made aware of need for urine sample.  

## 2019-07-12 LAB — URINE CULTURE: Culture: >=100000 — AB

## 2019-07-13 ENCOUNTER — Telehealth: Payer: Self-pay | Admitting: Emergency Medicine

## 2019-07-13 LAB — GC/CHLAMYDIA PROBE AMP (~~LOC~~) NOT AT ARMC
Chlamydia: NEGATIVE
Neisseria Gonorrhea: NEGATIVE

## 2019-07-13 NOTE — Telephone Encounter (Signed)
Post ED Visit - Positive Culture Follow-up: Successful Patient Follow-Up  Culture assessed and recommendations reviewed by:  []  , Pharm.D. []  Enzo Bi, Pharm.D., BCPS AQ-ID []  , Pharm.D., BCPS []  Celedonio Miyamoto, Pharm.D., BCPS []  [place], Garvin Fila.D., BCPS, AAHIVP []  , Pharm.D., BCPS, AAHIVP []  Georgina Pillion, PharmD, BCPS []  , PharmD, BCPS []  [place], PharmD, BCPS []  [place], PharmD Glogovic RPh  Positive urine culture  [x]  Patient discharged without antimicrobial prescription and treatment is now indicated []  Organism is resistant to prescribed ED discharge antimicrobial []  Patient with positive blood cultures  Changes discussed with ED provider: Dr Estella Husk New antibiotic prescription start Cephalexin 500mg  po q 6 hours x 7 days  Attempting to contact patient   07/13/2019, 10:42 AM

## 2019-07-13 NOTE — Progress Notes (Signed)
ED Antimicrobial Stewardship Positive Culture Follow Up   Tinisha Etzkorn is an 54 y.o. female who presented to [facility] on 07/10/2019 with a chief complaint of  Chief Complaint  Patient presents with  . rt sided pain    Recent Results (from the past 720 hour(s))  Wet prep, genital     Status: Abnormal   Collection Time: 07/10/19 10:44 AM   Specimen: PATH Cytology Cervicovaginal Ancillary Only  Result Value Ref Range Status   Yeast Wet Prep HPF POC NONE SEEN NONE SEEN Final   Trich, Wet Prep NONE SEEN NONE SEEN Final   Clue Cells Wet Prep HPF POC PRESENT (A) NONE SEEN Final   WBC, Wet Prep HPF POC MANY (A) NONE SEEN Final   Sperm NONE SEEN  Final    Comment: Performed at [facility], [address], [place], [place] [address]  Urine culture     Status: Abnormal   Collection Time: 07/10/19  2:49 PM   Specimen: Urine, Clean Catch  Result Value Ref Range Status   Specimen Description   Final    URINE, CLEAN CATCH Performed at [facility], [address] [place], [place] [address]    Special Requests   Final    NONE Performed at [facility], [address] [place], [place] [address]    Culture >=100,000 COLONIES/mL ESCHERICHIA COLI (A)  Final   Report Status 07/12/2019 FINAL  Final   Organism ID, Bacteria ESCHERICHIA COLI (A)  Final      Susceptibility   Escherichia coli - MIC*    AMPICILLIN <=2 SENSITIVE Sensitive     CEFAZOLIN <=4 SENSITIVE Sensitive     CEFTRIAXONE <=0.25 SENSITIVE Sensitive     CIPROFLOXACIN <=0.25 SENSITIVE Sensitive     GENTAMICIN <=1 SENSITIVE Sensitive     IMIPENEM <=0.25 SENSITIVE Sensitive     NITROFURANTOIN <=16 SENSITIVE Sensitive     TRIMETH/SULFA <=20 SENSITIVE Sensitive     AMPICILLIN/SULBACTAM <=2 SENSITIVE Sensitive     PIP/TAZO <=4 SENSITIVE Sensitive     * >=100,000 COLONIES/mL ESCHERICHIA COLI    [x]  Treated with doxycycline and metrondiazole, organism rdoes not cover []   Patient discharged originally without antimicrobial agent and treatment is now indicated   New antibiotic prescription:  - Cephalexin 500 mg PO q6h x 7 days  ED Provider: Dr. , PharmD, BCPS 07/13/2019 10:33 AM

## 2019-07-14 ENCOUNTER — Encounter (HOSPITAL_COMMUNITY): Payer: Self-pay

## 2019-07-19 NOTE — Progress Notes (Signed)
Patient ID: Marie Galloway, female   DOB: 1965/06/02, 54 y.o.   MRN: [id number]   Virtual Visit via Telephone Note  I connected with Marie Galloway on 07/21/19 at 10:10 AM EDT by telephone and verified that I am speaking with the correct person using two identifiers.   I discussed the limitations, risks, security and privacy concerns of performing an evaluation and management service by telephone and the availability of in person appointments. I also discussed with the patient that there may be a patient responsible charge related to this service. The patient expressed understanding and agreed to proceed.  PATIENT visit by telephone virtually in the context of Covid-19 pandemic. Patient location: My Location:  [facility] office Persons on the call:    History of Present Illness: After ED visit 07/10/2019 for side pain.  Pain has lessened in her flank area.  She is still having some soreness in her L leg.  She still has a few antibiotics left.  She was treated with Doxy and metronidazole.  No known health problems.  Appetite is good.  No fever.    From ED A/P: Pertinent labs & imaging results that were available during my care of the patient were reviewed by me and considered in my medical decision making (see chart for details).  Patient states she was seen in the ED 07/04/19- had CT imaging, discharged home with lidoderm patches & naproxen which she is utilizing without relief. She states they mixed her chart up with someone with the same name that day- confirms her DOB is 2065/09/17- states they put her information in another person's chart with a birthday on the [birth year]th-- correlated this with chart review. CT imaging that day of the abdomen/pelvis reviewed, no acute process.  MDM Rules/Calculators/A&P                      Patient presents to the ED with R sided pain (back, abdomen, pelvis, inner thigh).  She intermittently appears uncomfortable but is nontoxic. Vitals without significant abnormality.   Tender to the R CVA, lumbar paraspinal muscles, RLQ, and R medial thigh. NO peritoneal signs. NO neuro deficits. Recent CT for same sxs was negative for acute process.   CBC: No leukocytosis or anemia.  CMP: Mild hypocalcemia, no significant electrolyte derangement.  Lipase: WNL Preg test: Negative.  Wet prep: BV GC/chlaymdia: Pending. [place]:  Normal postmenopausal pelvic ultrasound. Small ovaries with no evidence of adnexal torsion.   On re-assessment patient feeling much better, she is ambulatory, she remains without peritoneal signs- recent CT w/ similar presentation without acute process- do not feel this needs repeat today. UA Pending, however covering w/ abx and culture sent- no urinary sxs currently. Suspect this is primarily MSK, but with her adnexal tenderness and being sexually active will cover for PID as well. Muscle relaxant to be prescribed, stop naproxen, start meloxicam to see if this works better for the patient. Doxy/flagyl- discussed no EtOH with this medicine. I discussed results, treatment plan, need for follow-up, and return precautions with the patient. Provided opportunity for questions, patient confirmed understanding and is in agreement with plan.   Findings and plan of care discussed with supervising physician Dr. Lynelle Doctor who is in agreement.   Observations/Objective:  NAD.  A&Ox3   Assessment and Plan: 1. Pain of left lower extremity - meloxicam (MOBIC) 15 MG tablet; Take 1 tablet (15 mg total) by mouth daily.  Dispense: 30 tablet; Refill: 0  2. Urinary tract infection without hematuria, site  unspecified Resolving;  No symptoms now  3. Yeast infection After 2 antibiotics will be likely-she can use this only if needed - fluconazole (DIFLUCAN) 150 MG tablet; Take 1 tablet (150 mg total) by mouth once for 1 dose.  Dispense: 1 tablet; Refill: 0  4. Encounter for examination following treatment at hospital    Follow Up Instructions: Assign PCP in 6-8 weeks    I discussed the assessment and treatment plan with the patient. The patient was provided an opportunity to ask questions and all were answered. The patient agreed with the plan and demonstrated an understanding of the instructions.   The patient was advised to call back or seek an in-person evaluation if the symptoms worsen or if the condition fails to improve as anticipated.  I provided 12 minutes of non-face-to-face time during this encounter.   Freeman Caldron, PA-C

## 2019-07-21 ENCOUNTER — Ambulatory Visit: Payer: 59 | Attending: Family Medicine | Admitting: Physician Assistant

## 2019-07-21 ENCOUNTER — Other Ambulatory Visit: Payer: Self-pay

## 2019-07-21 DIAGNOSIS — N390 Urinary tract infection, site not specified: Secondary | ICD-10-CM | POA: Diagnosis not present

## 2019-07-21 DIAGNOSIS — Z09 Encounter for follow-up examination after completed treatment for conditions other than malignant neoplasm: Secondary | ICD-10-CM | POA: Diagnosis not present

## 2019-07-21 DIAGNOSIS — M79605 Pain in left leg: Secondary | ICD-10-CM

## 2019-07-21 DIAGNOSIS — B379 Candidiasis, unspecified: Secondary | ICD-10-CM

## 2019-07-21 MED ORDER — MELOXICAM 15 MG PO TABS: 15.0000 mg | ORAL_TABLET | Freq: Every day | ORAL | 0 refills | Status: AC

## 2019-07-21 MED ORDER — FLUCONAZOLE 150 MG PO TABS: 150.0000 mg | ORAL_TABLET | Freq: Once | ORAL | 0 refills | Status: AC

## 2019-08-16 ENCOUNTER — Ambulatory Visit: Payer: 59 | Admitting: Internal Medicine

## 2020-08-02 IMAGING — US US PELVIS COMPLETE TRANSABD/TRANSVAG W DUPLEX
1 series · 13 of 25 positions shown · non-contrast
Comparison: None.

CLINICAL DATA: 53-year-old postmenopausal female presenting with
right lower quadrant and right lower extremity pain.

EXAM:
TRANSABDOMINAL AND TRANSVAGINAL ULTRASOUND OF PELVIS
DOPPLER ULTRASOUND OF OVARIES
TECHNIQUE: Both transabdominal and transvaginal ultrasound examinations of the
pelvis were performed. Transabdominal technique was performed for
global imaging of the pelvis including uterus, ovaries, adnexal
regions, and pelvic cul-de-sac.
It was necessary to proceed with endovaginal exam following the
transabdominal exam to visualize the endometrium and adnexa. Color
and duplex Doppler ultrasound was utilized to evaluate blood flow to
the ovaries.

[Series 1: us pelvis complete transabd/transvag w duplex · 13 of 49 slices shown]
[im 1/49]
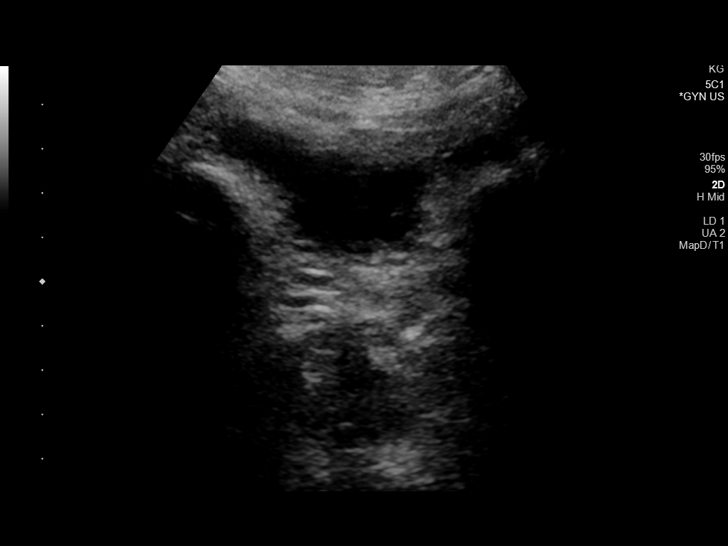
[im 5/49]
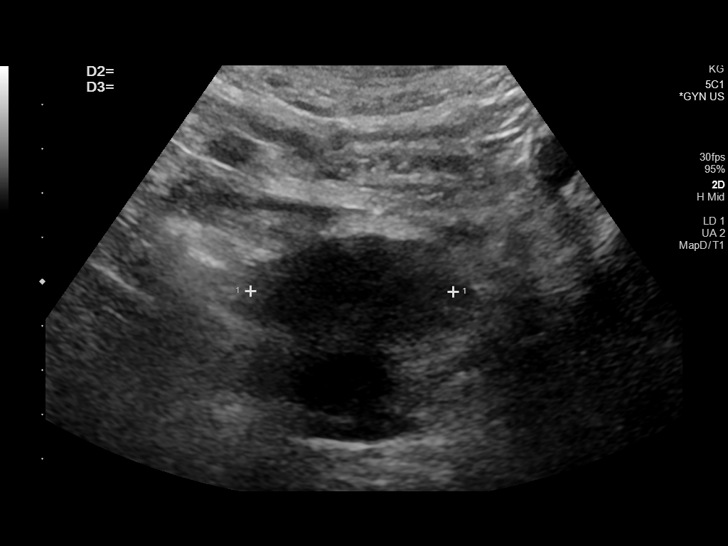
[im 9/49]
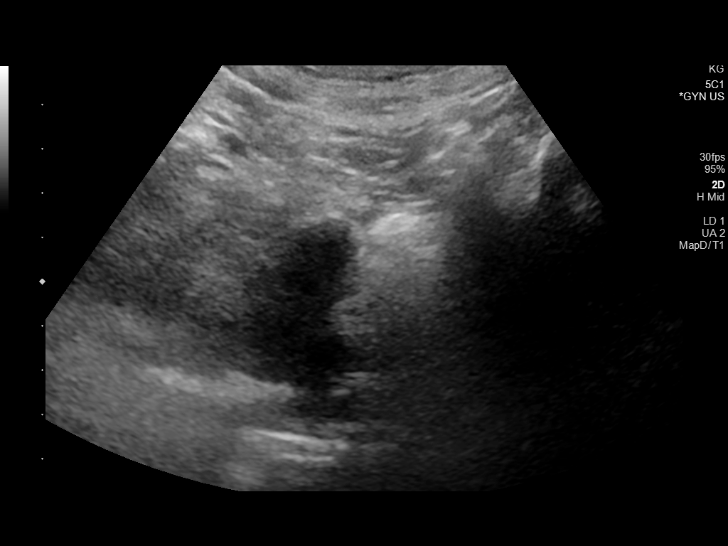
[im 13/49]
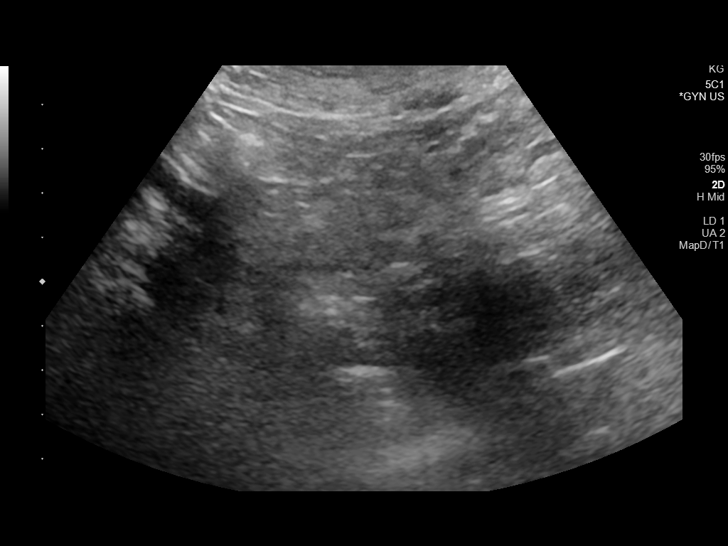
[im 17/49]
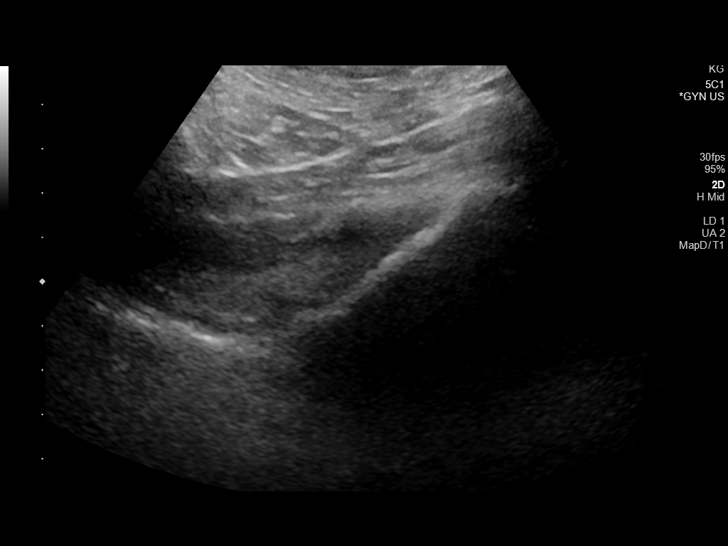
[im 21/49]
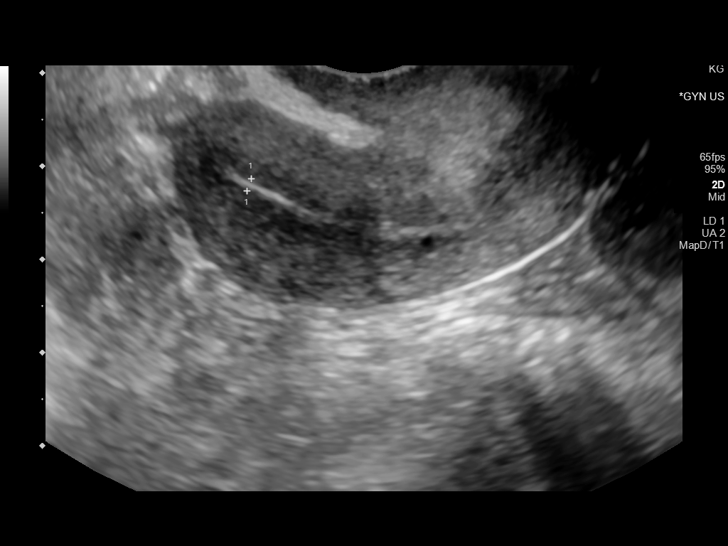
[im 25/49]
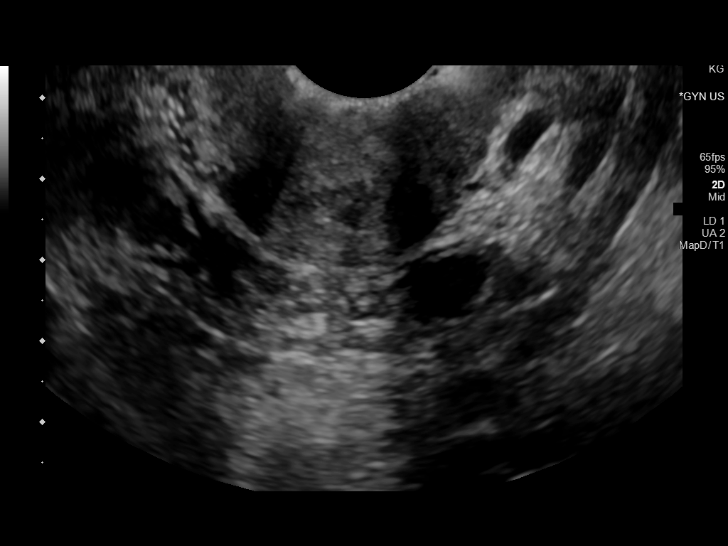
[im 29/49]
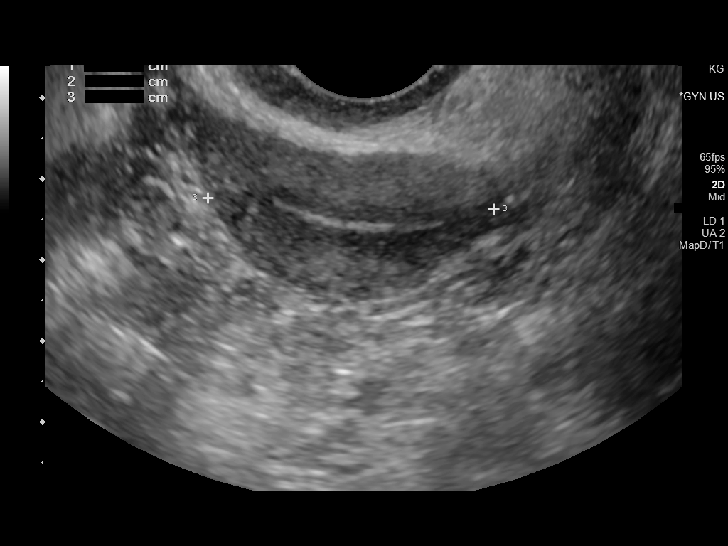
[im 33/49]
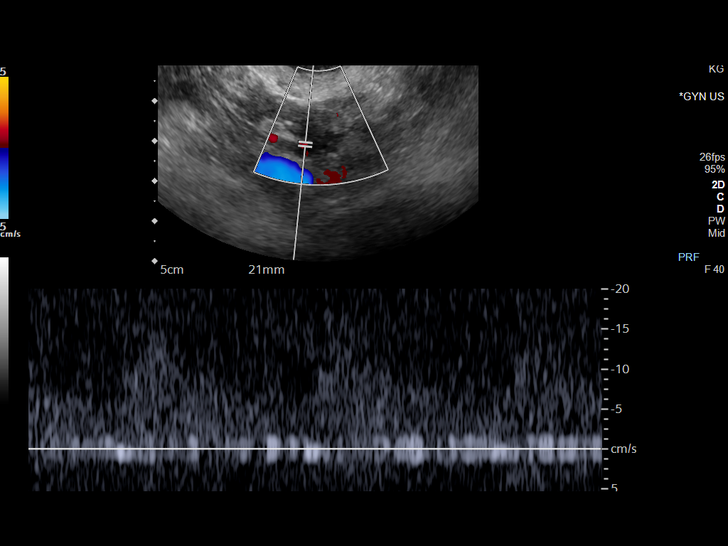
[im 37/49]
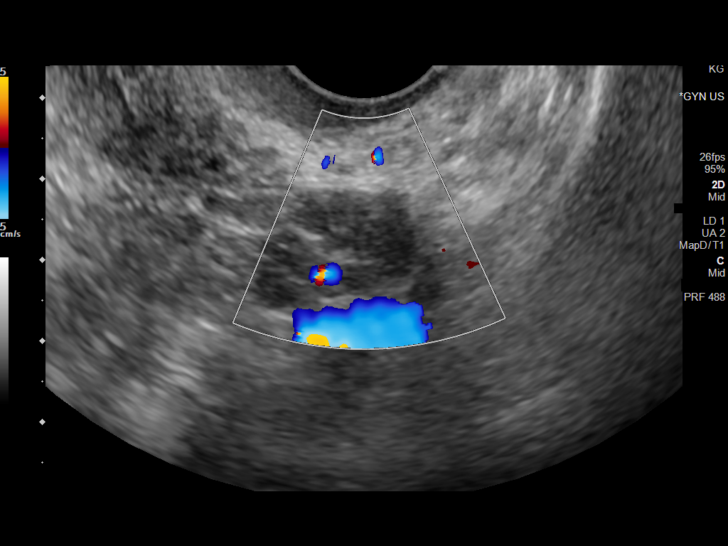
[im 41/49]
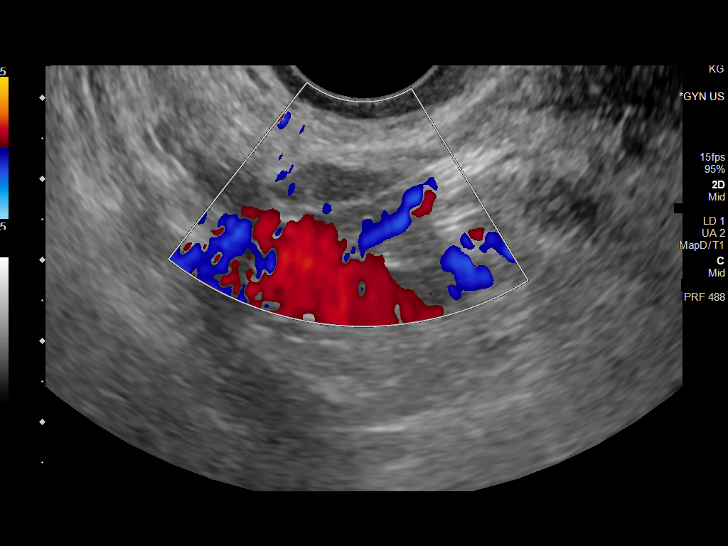
[im 45/49]
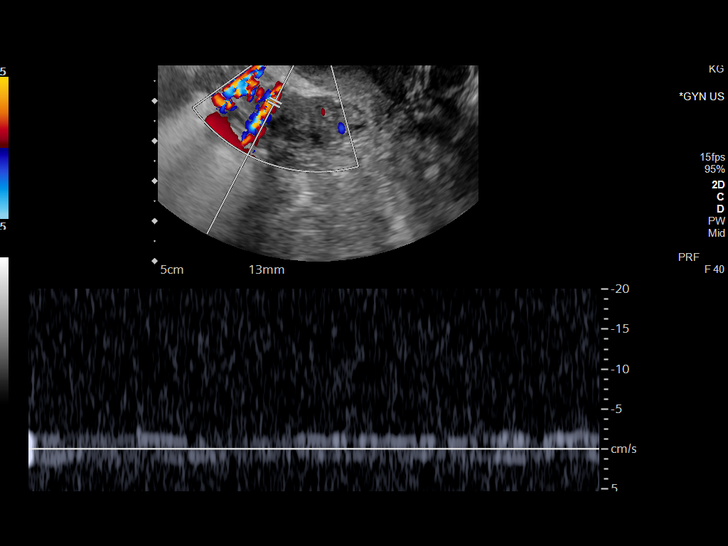
[im 49/49]
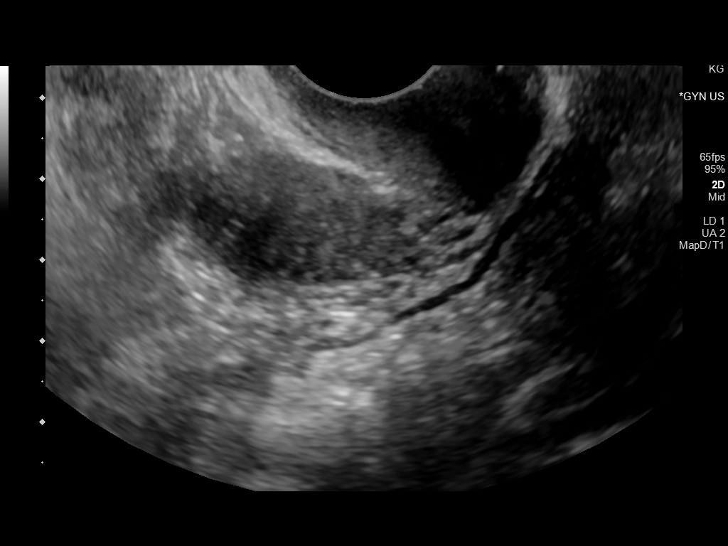

[13 of 25 positions shown; findings below may reference images not displayed]

FINDINGS: Uterus

Measurements: 5.3 x 2.1 x 3.5 cm = volume: 21 mL. Small anteverted
uterus is normal in configuration, with no uterine fibroids or other
myometrial abnormalities.

Endometrium

Thickness: 1 mm. No endometrial cavity fluid or focal endometrial
mass.

Right ovary

Measurements: 1.4 x 1.4 x 1.2 cm = volume: 1.3 mL. Normal
appearance/no adnexal mass.

Left ovary

Measurements: 1.5 x 1.5 x 1.7 cm = volume: 2.0 mL. Normal
appearance/no adnexal mass.

Pulsed Doppler evaluation of both ovaries demonstrates normal
low-resistance arterial and venous waveforms.

Other findings

No abnormal free fluid.
IMPRESSION: Normal postmenopausal pelvic ultrasound. Small ovaries with no
evidence of adnexal torsion.
